# Patient Record
Sex: Male | Born: 2000 | Race: White | Hispanic: No | Marital: Single | Smoking: Never smoker
Health system: Southern US, Community
[De-identification: ages and names within clinical notes are randomized; demographics above are authoritative.]

## PROBLEM LIST (undated history)

## (undated) DIAGNOSIS — R011 Cardiac murmur, unspecified: Secondary | ICD-10-CM

## (undated) DIAGNOSIS — J45909 Unspecified asthma, uncomplicated: Secondary | ICD-10-CM

## (undated) HISTORY — PX: NO PAST SURGERIES: SHX2092

---
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
## 2018-06-26 ENCOUNTER — Encounter: Payer: Self-pay | Admitting: Emergency Medicine

## 2018-06-26 ENCOUNTER — Other Ambulatory Visit: Payer: Self-pay

## 2018-06-26 ENCOUNTER — Ambulatory Visit
Admission: EM | Admit: 2018-06-26 | Discharge: 2018-06-26 | Disposition: A | Discharge status: Home / Self Care | Payer: Medicaid Other | Attending: Family Medicine | Admitting: Family Medicine

## 2018-06-26 DIAGNOSIS — R062 Wheezing: Secondary | ICD-10-CM

## 2018-06-26 DIAGNOSIS — R05 Cough: Secondary | ICD-10-CM | POA: Diagnosis not present

## 2018-06-26 DIAGNOSIS — J209 Acute bronchitis, unspecified: Secondary | ICD-10-CM

## 2018-06-26 DIAGNOSIS — R0982 Postnasal drip: Secondary | ICD-10-CM

## 2018-06-26 DIAGNOSIS — R0981 Nasal congestion: Secondary | ICD-10-CM

## 2018-06-26 DIAGNOSIS — Z8709 Personal history of other diseases of the respiratory system: Secondary | ICD-10-CM

## 2018-06-26 MED ORDER — PREDNISONE 20 MG PO TABS: 40.0000 mg | ORAL_TABLET | Freq: Every day | ORAL | 0 refills | Status: DC

## 2018-06-26 MED ORDER — AZITHROMYCIN 250 MG PO TABS: ORAL_TABLET | ORAL | 0 refills | Status: DC

## 2018-06-26 NOTE — ED Provider Notes (Signed)
[facility] ____________________________________________  Time seen: Approximately 1:45 PM  I have reviewed the triage vital signs and the nursing notes.   HISTORY  Chief Complaint Cough   HPI Alejandro Pope is a 18 y.o. male past medical history of asthma presenting for evaluation of cough complaints present for the last 3 to 4 weeks.  States initially started off with a cold with runny nose, nasal congestion and cough.  States nasal congestion nasal drainage has near fully resolved but continues with the cough.  Has had intermittent wheezing.  States does feel sore in his chest from coughing.  Denies chest pain or shortness of breath.  No hemoptysis.  States sputum is greenish in color.  Denies sinus pain.  Denies known fevers.  Unresolved with over-the-counter cough and congestion medication.  Does still have albuterol inhaler at home and has occasionally used which helps.  Reports otherwise doing well.  Denies recent sickness.   Past Medical History:  Diagnosis Date  . Asthma   . Heart murmur     There are no active problems to display for this patient.   History reviewed. No pertinent surgical history.   No current facility-administered medications for this encounter.   Current Outpatient Medications:  .  albuterol (PROVENTIL HFA;VENTOLIN HFA) 108 (90 Base) MCG/ACT inhaler, Inhale 1-2 puffs into the lungs every 6 (six) hours as needed for wheezing or shortness of breath., Disp: , Rfl:  .  azithromycin (ZITHROMAX Z-PAK) 250 MG tablet, Take 2 tablets (500 mg) on  Day 1,  followed by 1 tablet (250 mg) once daily on Days 2 through 5., Disp: 6 each, Rfl: 0 .  hydrOXYzine (VISTARIL) 25 MG capsule, Take 1 capsule (25 mg total) by mouth every 8 (eight) hours as needed for anxiety., Disp: 30 capsule, Rfl: 0 .  predniSONE (DELTASONE) 20 MG tablet, Take 2 tablets (40 mg total) by mouth daily., Disp: 10 tablet, Rfl: 0  Allergies Patient has no known allergies.  History  reviewed. No pertinent family history.  Social History Social History   Tobacco Use  . Smoking status: Never Smoker  . Smokeless tobacco: Never Used  Substance Use Topics  . Alcohol use: No  . Drug use: Never    Review of Systems Constitutional: No fever ENT: No sore throat. As above.  Cardiovascular: Denies chest pain. Respiratory: Denies shortness of breath. Gastrointestinal: No abdominal pain.   Musculoskeletal: Negative for back pain. Skin: Negative for rash.   ____________________________________________   PHYSICAL EXAM:  VITAL SIGNS: ED Triage Vitals  Enc Vitals Group     BP 06/26/18 1208 113/73     Pulse Rate 06/26/18 1208 77     Resp 06/26/18 1208 16     Temp 06/26/18 1208 98.1 F (36.7 C)     Temp Source 06/26/18 1208 Oral     SpO2 06/26/18 1208 99 %     Weight 06/26/18 1206 150 lb (68 kg)     Height 06/26/18 1206 6' (1.829 m)     Head Circumference --      Peak Flow --      Pain Score 06/26/18 1206 9     Pain Loc --      Pain Edu? --      Excl. in GC? --     Constitutional: Alert and oriented. Well appearing and in no acute distress. Eyes: Conjunctivae are normal. Head: Atraumatic. No sinus tenderness to palpation. No swelling. No erythema.  Ears: no erythema, normal TMs bilaterally.  Nose:Mild nasal congestion  Mouth/Throat: Mucous membranes are moist. No pharyngeal erythema. No tonsillar swelling or exudate.  Neck: No stridor.  No cervical spine tenderness to palpation. Hematological/Lymphatic/Immunilogical: No cervical lymphadenopathy. Cardiovascular: Normal rate, regular rhythm. Grossly normal heart sounds.  Good peripheral circulation. Respiratory: Normal respiratory effort.  No retractions.  Mild scattered rhonchi.  No wheezing.  Good air movement.  Occasional dry cough noted with bronchospasm.  Speaks in complete sentences. Musculoskeletal: Ambulatory with steady gait. No cervical, thoracic or lumbar tenderness to palpation.  No lower  extremity edema noted bilaterally. Neurologic:  Normal speech and language. No gait instability. Skin:  Skin appears warm, dry and intact. No rash noted. Psychiatric: Mood and affect are normal. Speech and behavior are normal. ___________________________________________   LABS (all labs ordered are listed, but only abnormal results are displayed)  Labs Reviewed - No data to display ____________________________________________   PROCEDURES Procedures    INITIAL IMPRESSION / ASSESSMENT AND PLAN / ED COURSE  Pertinent labs & imaging results that were available during my care of the patient were reviewed by me and considered in my medical decision making (see chart for details).  Well-appearing patient.  No acute distress.  Suspect recent viral upper respiratory infection.  Suspect bronchitis.  As symptoms have continued for 3 to 4 weeks, will empirically treat with oral azithromycin, prednisone and continue home albuterol inhaler as needed.  Continue over-the-counter cough congestion medication.  Work note given for today.Discussed indication, risks and benefits of medications with patient.  Discussed follow up with Primary care physician this week. Discussed follow up and return parameters including no resolution or any worsening concerns. Patient verbalized understanding and agreed to plan.   ____________________________________________   FINAL CLINICAL IMPRESSION(S) / ED DIAGNOSES  Final diagnoses:  Acute bronchitis, unspecified organism     ED Discharge Orders         Ordered    predniSONE (DELTASONE) 20 MG tablet  Daily     06/26/18 1250    azithromycin (ZITHROMAX Z-PAK) 250 MG tablet     06/26/18 1250           Note: This dictation was prepared with Dragon dictation along with smaller phrase technology. Any transcriptional errors that result from this process are unintentional.         Renford Dills, NP 06/26/18 1348

## 2018-06-26 NOTE — ED Triage Notes (Signed)
Patient c/o cough and chest congestion for a month.  Patient reports fever.

## 2018-06-26 NOTE — Discharge Instructions (Addendum)
Take medication as prescribed. Rest. Drink plenty of fluids. Continue inhaler.   Follow up with your primary care physician this week as needed. Return to Urgent care for new or worsening concerns.

## 2018-09-09 ENCOUNTER — Other Ambulatory Visit: Payer: Self-pay

## 2018-09-09 ENCOUNTER — Ambulatory Visit
Admission: EM | Admit: 2018-09-09 | Discharge: 2018-09-09 | Disposition: A | Discharge status: Home / Self Care | Payer: Medicaid Other | Attending: Family Medicine | Admitting: Family Medicine

## 2018-09-09 DIAGNOSIS — M79672 Pain in left foot: Secondary | ICD-10-CM | POA: Diagnosis not present

## 2018-09-09 DIAGNOSIS — M79671 Pain in right foot: Secondary | ICD-10-CM

## 2018-09-09 MED ORDER — MELOXICAM 15 MG PO TABS: 15.0000 mg | ORAL_TABLET | Freq: Every day | ORAL | 0 refills | Status: DC | PRN

## 2018-09-09 NOTE — Discharge Instructions (Signed)
Rest.  Ice.  Try heel cups in your shoes.  Medication as prescribed.  Take care  Dr. Adriana Simas
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
## 2018-11-03 ENCOUNTER — Emergency Department: Payer: Medicaid Other

## 2018-11-03 ENCOUNTER — Other Ambulatory Visit: Payer: Self-pay

## 2018-11-03 ENCOUNTER — Encounter: Payer: Self-pay | Admitting: Emergency Medicine

## 2018-11-03 ENCOUNTER — Emergency Department
Admission: EM | Admit: 2018-11-03 | Discharge: 2018-11-03 | Disposition: A | Discharge status: Home / Self Care | Payer: Medicaid Other | Attending: Student in an Organized Health Care Education/Training Program | Admitting: Student in an Organized Health Care Education/Training Program

## 2018-11-03 DIAGNOSIS — J45909 Unspecified asthma, uncomplicated: Secondary | ICD-10-CM | POA: Insufficient documentation

## 2018-11-03 DIAGNOSIS — S161XXA Strain of muscle, fascia and tendon at neck level, initial encounter: Secondary | ICD-10-CM | POA: Diagnosis not present

## 2018-11-03 DIAGNOSIS — M25551 Pain in right hip: Secondary | ICD-10-CM | POA: Insufficient documentation

## 2018-11-03 DIAGNOSIS — Y9389 Activity, other specified: Secondary | ICD-10-CM | POA: Insufficient documentation

## 2018-11-03 DIAGNOSIS — Y999 Unspecified external cause status: Secondary | ICD-10-CM | POA: Insufficient documentation

## 2018-11-03 DIAGNOSIS — M25552 Pain in left hip: Secondary | ICD-10-CM | POA: Insufficient documentation

## 2018-11-03 DIAGNOSIS — M7918 Myalgia, other site: Secondary | ICD-10-CM

## 2018-11-03 DIAGNOSIS — Y92411 Interstate highway as the place of occurrence of the external cause: Secondary | ICD-10-CM | POA: Insufficient documentation

## 2018-11-03 DIAGNOSIS — S199XXA Unspecified injury of neck, initial encounter: Secondary | ICD-10-CM | POA: Diagnosis present

## 2018-11-03 DIAGNOSIS — R51 Headache: Secondary | ICD-10-CM | POA: Insufficient documentation

## 2018-11-03 MED ORDER — IBUPROFEN 600 MG PO TABS
600.0000 mg | ORAL_TABLET | Freq: Once | ORAL | Status: AC
  Administered 2018-11-03: 600 mg via ORAL
  Filled 2018-11-03: qty 1

## 2018-11-03 MED ORDER — TRAMADOL HCL 50 MG PO TABS: 50.0000 mg | ORAL_TABLET | Freq: Four times a day (QID) | ORAL | 0 refills | Status: AC | PRN

## 2018-11-03 MED ORDER — IBUPROFEN 600 MG PO TABS: 600.0000 mg | ORAL_TABLET | Freq: Three times a day (TID) | ORAL | 0 refills | Status: DC | PRN

## 2018-11-03 MED ORDER — CYCLOBENZAPRINE HCL 10 MG PO TABS
10.0000 mg | ORAL_TABLET | Freq: Once | ORAL | Status: AC
  Administered 2018-11-03: 10 mg via ORAL
  Filled 2018-11-03: qty 1

## 2018-11-03 MED ORDER — CYCLOBENZAPRINE HCL 10 MG PO TABS: 10.0000 mg | ORAL_TABLET | Freq: Three times a day (TID) | ORAL | 0 refills | Status: DC | PRN

## 2018-11-03 MED ORDER — TRAMADOL HCL 50 MG PO TABS
50.0000 mg | ORAL_TABLET | Freq: Once | ORAL | Status: AC
  Administered 2018-11-03: 50 mg via ORAL
  Filled 2018-11-03: qty 1
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
## 2018-12-01 ENCOUNTER — Telehealth: Payer: Medicaid Other | Admitting: Family

## 2018-12-01 DIAGNOSIS — Z20822 Contact with and (suspected) exposure to covid-19: Secondary | ICD-10-CM

## 2018-12-01 NOTE — Progress Notes (Signed)
E-Visit for Corona Virus Screening   Your current symptoms could be consistent with the coronavirus.  Many health care providers can now test patients at their office but not all are.  [facility] has multiple testing sites. For information on our COVID testing locations and hours go to [email or web address]  Please quarantine yourself while awaiting your test results.  We are enrolling you in our MyChart [facility] for COVID19 . Daily you will receive a questionnaire within the MyChart website. Our COVID 19 response team willl be monitoriing your responses daily.    COVID-19 is a respiratory illness with symptoms that are similar to the flu. Symptoms are typically mild to moderate, but there have been cases of severe illness and death due to the virus. The following symptoms may appear 2-14 days after exposure: . Fever . Cough . Shortness of breath or difficulty breathing . Chills . Repeated shaking with chills . Muscle pain . Headache . Sore throat . New loss of taste or smell . Fatigue . Congestion or runny nose . Nausea or vomiting . Diarrhea  It is vitally important that if you feel that you have an infection such as this virus or any other virus that you stay home and away from places where you may spread it to others.  You should self-quarantine for 14 days if you have symptoms that could potentially be coronavirus or have been in close contact a with a person diagnosed with COVID-19 within the last 2 weeks. You should avoid contact with people age 65 and older.   You should wear a mask or cloth face covering over your nose and mouth if you must be around other people or animals, including pets (even at home). Try to stay at least 6 feet away from other people. This will protect the people around you.  You may also take acetaminophen (Tylenol) as needed for fever.   Reduce your risk of any infection by using the same precautions used for avoiding the  common cold or flu:  . Wash your hands often with soap and warm water for at least 20 seconds.  If soap and water are not readily available, use an alcohol-based hand sanitizer with at least 60% alcohol.  . If coughing or sneezing, cover your mouth and nose by coughing or sneezing into the elbow areas of your shirt or coat, into a tissue or into your sleeve (not your hands). . Avoid shaking hands with others and consider head nods or verbal greetings only. . Avoid touching your eyes, nose, or mouth with unwashed hands.  . Avoid close contact with people who are sick. . Avoid places or events with large numbers of people in one location, like concerts or sporting events. . Carefully consider travel plans you have or are making. . If you are planning any travel outside or inside the [place], visit the CDC's Travelers' Health webpage for the latest health notices. . If you have some symptoms but not all symptoms, continue to monitor at home and seek medical attention if your symptoms worsen. . If you are having a medical emergency, call 911.  HOME CARE . Only take medications as instructed by your medical team. . Drink plenty of fluids and get plenty of rest. . A steam or ultrasonic humidifier can help if you have congestion.   GET HELP RIGHT AWAY IF YOU HAVE EMERGENCY WARNING SIGNS** FOR COVID-19. If you or someone is showing any of these signs seek emergency medical care immediately. Call   911 or proceed to your closest emergency facility if: . You develop worsening high fever. . Trouble breathing . Bluish lips or face . Persistent pain or pressure in the chest . New confusion . Inability to wake or stay awake . You cough up blood. . Your symptoms become more severe  **This list is not all possible symptoms. Contact your medical provider for any symptoms that are sever or concerning to you.   MAKE SURE YOU   Understand these instructions.  Will watch your condition.  Will get help right  away if you are not doing well or get worse.  Your e-visit answers were reviewed by a board certified advanced clinical practitioner to complete your personal care plan.  Depending on the condition, your plan could have included both over the counter or prescription medications.  If there is a problem please reply once you have received a response from your provider.  Your safety is important to us.  If you have drug allergies check your prescription carefully.    You can use MyChart to ask questions about today's visit, request a non-urgent call back, or ask for a work or school excuse for 24 hours related to this e-Visit. If it has been greater than 24 hours you will need to follow up with your provider, or enter a new e-Visit to address those concerns. You will get an e-mail in the next two days asking about your experience.  I hope that your e-visit has been valuable and will speed your recovery. Thank you for using e-visits.   Greater than 5 minutes, yet less than 10 minutes of time have been spent researching, coordinating, and implementing care for this patient today.  Thank you for the details you included in the comment boxes. Those details are very helpful in determining the best course of treatment for you and help us to provide the best care.  
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
## 2020-03-28 ENCOUNTER — Ambulatory Visit: Payer: Medicaid Other | Admitting: Family Medicine

## 2020-03-28 NOTE — Progress Notes (Deleted)
New patient visit   Patient: Alejandro Pope   DOB: July 23, 2000   19 y.o. Male  MRN: [id number] Visit Date: 03/28/2020  Today's healthcare provider: Dortha Kern, PA   No chief complaint on file.  Subjective    Alejandro Pope is a 19 y.o. male who presents today as a new patient to establish care.  HPI  ***  Past Medical History:  Diagnosis Date  . Asthma   . Heart murmur    Past Surgical History:  Procedure Laterality Date  . NO PAST SURGERIES     Family Status  Relation Name Status  . Mother  Deceased  . Father unknown Other   No family history on file. Social History   Socioeconomic History  . Marital status: Single    Spouse name: Not on file  . Number of children: Not on file  . Years of education: Not on file  . Highest education level: Not on file  Occupational History  . Not on file  Tobacco Use  . Smoking status: Never Smoker  . Smokeless tobacco: Never Used  Vaping Use  . Vaping Use: Former  Substance and Sexual Activity  . Alcohol use: No  . Drug use: Yes    Types: Marijuana  . Sexual activity: Not on file  Other Topics Concern  . Not on file  Social History Narrative  . Not on file   Social Determinants of Health   Financial Resource Strain:   . Difficulty of Paying Living Expenses: Not on file  Food Insecurity:   . Worried About Programme researcher, broadcasting/film/video in the Last Year: Not on file  . Ran Out of Food in the Last Year: Not on file  Transportation Needs:   . Lack of Transportation (Medical): Not on file  . Lack of Transportation (Non-Medical): Not on file  Physical Activity:   . Days of Exercise per Week: Not on file  . Minutes of Exercise per Session: Not on file  Stress:   . Feeling of Stress : Not on file  Social Connections:   . Frequency of Communication with Friends and Family: Not on file  . Frequency of Social Gatherings with Friends and Family: Not on file  . Attends Religious Services: Not on file  . Active Member of  Clubs or Organizations: Not on file  . Attends Banker Meetings: Not on file  . Marital Status: Not on file   Outpatient Medications Prior to Visit  Medication Sig  . cyclobenzaprine (FLEXERIL) 10 MG tablet Take 1 tablet (10 mg total) by mouth 3 (three) times daily as needed.  Marland Kitchen ibuprofen (ADVIL) 600 MG tablet Take 1 tablet (600 mg total) by mouth every 8 (eight) hours as needed.  . meloxicam (MOBIC) 15 MG tablet Take 1 tablet (15 mg total) by mouth daily as needed for pain.   No facility-administered medications prior to visit.   No Known Allergies   There is no immunization history on file for this patient.  Health Maintenance  Topic Date Due  . Hepatitis C Screening  Never done  . COVID-19 Vaccine (1) Never done  . HIV Screening  Never done  . TETANUS/TDAP  Never done  . INFLUENZA VACCINE  Never done    Patient Care Team: Herb Grays, MD as PCP - General (Pediatrics)  Review of Systems  Constitutional: Negative.   HENT: Negative.   Eyes: Negative.   Respiratory: Negative.   Cardiovascular: Negative.   Gastrointestinal:  Negative.   Endocrine: Negative.   Genitourinary: Negative.   Musculoskeletal: Negative.   Skin: Negative.   Allergic/Immunologic: Negative.   Neurological: Negative.   Hematological: Negative.   Psychiatric/Behavioral: Negative.     {Heme  Chem  Endocrine  Serology  Results Review (optional):[id number]::" "}  Objective    There were no vitals taken for this visit. Physical Exam Constitutional:      Appearance: Normal appearance. He is normal weight.  HENT:     Head: Normocephalic and atraumatic.     Right Ear: Tympanic membrane, ear canal and external ear normal.     Left Ear: Tympanic membrane, ear canal and external ear normal.     Nose: Nose normal.     Mouth/Throat:     Mouth: Mucous membranes are moist.     Pharynx: Oropharynx is clear.  Eyes:     Extraocular Movements: Extraocular movements intact.      Conjunctiva/sclera: Conjunctivae normal.     Pupils: Pupils are equal, round, and reactive to light.  Cardiovascular:     Rate and Rhythm: Normal rate and regular rhythm.     Pulses: Normal pulses.     Heart sounds: Normal heart sounds.  Pulmonary:     Effort: Pulmonary effort is normal.     Breath sounds: Normal breath sounds.  Abdominal:     General: Abdomen is flat. Bowel sounds are normal.     Palpations: Abdomen is soft.  Musculoskeletal:        General: Normal range of motion.     Cervical back: Normal range of motion and neck supple.  Skin:    General: Skin is warm and dry.  Neurological:     General: No focal deficit present.     Mental Status: He is alert and oriented to person, place, and time. Mental status is at baseline.  Psychiatric:        Mood and Affect: Mood normal.        Behavior: Behavior normal.        Thought Content: Thought content normal.        Judgment: Judgment normal.    ***  Depression Screen No flowsheet data found. No results found for any visits on 03/28/20.  Assessment & Plan     ***  No follow-ups on file.     {provider attestation***:1}   Dortha Kern, PA  [facility] [phone] (phone) [phone] (fax)  [facility]

## 2022-01-29 ENCOUNTER — Encounter: Payer: Self-pay | Admitting: Emergency Medicine

## 2022-01-29 ENCOUNTER — Emergency Department
Admission: EM | Admit: 2022-01-29 | Discharge: 2022-01-29 | Disposition: A | Discharge status: Home / Self Care | Payer: 59 | Attending: Emergency Medicine | Admitting: Emergency Medicine

## 2022-01-29 ENCOUNTER — Other Ambulatory Visit: Payer: Self-pay

## 2022-01-29 DIAGNOSIS — J029 Acute pharyngitis, unspecified: Secondary | ICD-10-CM | POA: Diagnosis not present

## 2022-01-29 DIAGNOSIS — B2790 Infectious mononucleosis, unspecified without complication: Secondary | ICD-10-CM | POA: Diagnosis not present

## 2022-01-29 MED ORDER — CEPACOL REGULAR STRENGTH 3 MG MT LOZG: 1.0000 | LOZENGE | OROMUCOSAL | 12 refills | Status: DC | PRN

## 2022-01-29 MED ORDER — DEXAMETHASONE SODIUM PHOSPHATE 10 MG/ML IJ SOLN
10.0000 mg | Freq: Once | INTRAMUSCULAR | Status: AC
  Administered 2022-01-29: 10 mg via INTRAMUSCULAR
  Filled 2022-01-29: qty 1

## 2022-01-29 MED ORDER — MELOXICAM 15 MG PO TABS: 15.0000 mg | ORAL_TABLET | Freq: Every day | ORAL | 0 refills | Status: AC

## 2022-01-29 NOTE — ED Provider Notes (Signed)
[facility] Provider Note    Event Date/Time   First MD Initiated Contact with Patient 01/29/22 1348     (approximate)   History   Chief Complaint Sore Throat   HPI Alejandro Pope is a 21 y.o. male, no significant medical history, presents emergency department for evaluation of sore throat.  He states that he was seen at urgent care yesterday and diagnosed with mononucleosis.  His symptoms started approximately 4 days ago, which include sore throat and body aches.  The urgent care reportedly did not provide any medications to manage symptoms.  He states that his throat hurt so bad that it is difficult to eat or drink anything.  He is currently only taking Tylenol and doing salt water rinses.  Denies chest pain, shortness of breath, abdominal pain, flank pain, nausea/vomiting, diarrhea, dysuria, vision change, hearing changes, rash/lesions, numb/tingling upper or lower extremities, or dizziness/lightheadedness.  History Limitations: No limitations.        Physical Exam  Triage Vital Signs: ED Triage Vitals [01/29/22 1323]  Enc Vitals Group     BP 126/85     Pulse Rate 78     Resp 16     Temp 98.4 F (36.9 C)     Temp Source Oral     SpO2 100 %     Weight 145 lb (65.8 kg)     Height 5\' 10"  (1.778 m)     Head Circumference      Peak Flow      Pain Score 10     Pain Loc      Pain Edu?      Excl. in [place]?     Most recent vital signs: Vitals:   01/29/22 1323  BP: 126/85  Pulse: 78  Resp: 16  Temp: 98.4 F (36.9 C)  SpO2: 100%    General: Awake, NAD.  Skin: Warm, dry. No rashes or lesions.  Eyes: PERRL. Conjunctivae normal.  CV: Good peripheral perfusion.  Resp: Normal effort.  Abd: Soft, non-tender. No distention.  Neuro: At baseline. No gross neurological deficits.  Musculoskeletal: Normal ROM of all extremities.  Focused Exam: Throat is exquisitely erythematous.  No tonsillar swelling or exudates.  No trismus or drooling.  No  oropharyngeal edema.  Physical Exam    ED Results / Procedures / Treatments  Labs (all labs ordered are listed, but only abnormal results are displayed) Labs Reviewed - No data to display   EKG N/A.    RADIOLOGY  ED Provider Interpretation: N/A.  No results found.  PROCEDURES:  Critical Care performed: N/A.  Procedures    MEDICATIONS ORDERED IN ED: Medications  dexamethasone (DECADRON) injection 10 mg (10 mg Intramuscular Given 01/29/22 1456)     IMPRESSION / MDM / ASSESSMENT AND PLAN / ED COURSE  I reviewed the triage vital signs and the nursing notes.                              Differential diagnosis includes, but is not limited to, viral URI, infectious mononucleosis, COVID-19, influenza,  Assessment/Plan Presentation consistent with infectious mononucleosis.  He is currently on day 4 of his symptoms and is seeking supportive treatment.  Physical exam does show a notably erythematous throat, consistent with the patient's chief complaint.  Endorses some feeling of swelling in the throat as well making eating/drinking difficult.  We will provide with a single dose of dexamethasone here today.  Additionally provide him with prescriptions for meloxicam and cepacol throat lozenges to help manage his symptoms.  Recommend that he follow-up with his primary care provider within the next week for reevaluation.  He was amenable to this plan.  Will discharge.  Provided the patient with anticipatory guidance, return precautions, and educational material. Encouraged the patient to return to the emergency department at any time if they begin to experience any new or worsening symptoms. Patient expressed understanding and agreed with the plan.   Patient's presentation is most consistent with acute, uncomplicated illness.       FINAL CLINICAL IMPRESSION(S) / ED DIAGNOSES   Final diagnoses:  Infectious mononucleosis without complication, infectious mononucleosis due to  unspecified organism  Sore throat     Rx / DC Orders   ED Discharge Orders          Ordered    meloxicam (MOBIC) 15 MG tablet  Daily        01/29/22 1443    menthol-cetylpyridinium (CEPACOL REGULAR STRENGTH) 3 MG lozenge  As needed        01/29/22 1443             Note:  This document was prepared using Dragon voice recognition software and may include unintentional dictation errors.   Teodoro Spray, [place] 01/29/22 1515    Harvest Dark, MD 01/30/22 1530

## 2022-01-29 NOTE — ED Notes (Signed)
See triage note Presents with sore throat  States he was seen and dx'd with Mono yesterday  Afebrile at present  Did have negative COVID and strep test yesterday

## 2022-01-29 NOTE — Discharge Instructions (Addendum)
-  You may take the meloxicam as need for the body aches.  You may additionally take acetaminophen.  -You may utilize the cepacol lozenges as needed for sore throat.  -Follow-up with your primary care provider within the next week for reevaluation.  -Return to the emergency department anytime if you begin to experience any new or worsening symptoms.

## 2022-01-29 NOTE — ED Provider Notes (Incomplete)
   [facility] Provider Note    Event Date/Time   First MD Initiated Contact with Patient 01/29/22 1348     (approximate)   History   Chief Complaint Sore Throat   HPI Alejandro Pope is a 21 y.o. male, history of asthma, presents to the emergency department for evaluation of sore throat.  *** Denies fever/chills, chest pain, shortness of breath, abdominal pain, flank pain, nausea/vomiting, diarrhea, urinary symptoms, headache, vision changes, hearing changes, rashes/lesions, numbness/tingling in upper or lower extremities, or dizziness/lightheadedness.   Per records review, ***  History Limitations: ***        Physical Exam  Triage Vital Signs: ED Triage Vitals [01/29/22 1323]  Enc Vitals Group     BP 126/85     Pulse Rate 78     Resp 16     Temp 98.4 F (36.9 C)     Temp Source Oral     SpO2 100 %     Weight 145 lb (65.8 kg)     Height 5\' 10"  (1.778 m)     Head Circumference      Peak Flow      Pain Score 10     Pain Loc      Pain Edu?      Excl. in [place]?     Most recent vital signs: Vitals:   01/29/22 1323  BP: 126/85  Pulse: 78  Resp: 16  Temp: 98.4 F (36.9 C)  SpO2: 100%    General: Awake, NAD.  Skin: Warm, dry. No rashes or lesions.  Eyes: PERRL. Conjunctivae normal.  CV: Good peripheral perfusion.  Resp: Normal effort.  Abd: Soft, non-tender. No distention.  Neuro: At baseline. No gross neurological deficits.  Musculoskeletal: Normal ROM of all extremities.  Focused Exam: ***  Physical Exam    ED Results / Procedures / Treatments  Labs (all labs ordered are listed, but only abnormal results are displayed) Labs Reviewed - No data to display   EKG ***    RADIOLOGY  ED Provider Interpretation: ***  No results found.  PROCEDURES:  Critical Care performed: ***  Procedures    MEDICATIONS ORDERED IN ED: Medications - No data to display   IMPRESSION / MDM / [place] / ED COURSE  I  reviewed the triage vital signs and the nursing notes.                              Differential diagnosis includes, but is not limited to, ***  ED Course ***  Assessment/Plan ***  Considered admission for this patient, but ***  ***Provided the patient with anticipatory guidance, return precautions, and educational material. Encouraged the patient to return to the emergency department at any time if they begin to experience any new or worsening symptoms. Patient expressed understanding and agreed with the plan.   Patient's presentation is most consistent with {EM COPA:27473}       FINAL CLINICAL IMPRESSION(S) / ED DIAGNOSES   Final diagnoses:  None     Rx / DC Orders   ED Discharge Orders     None        Note:  This document was prepared using Dragon voice recognition software and may include unintentional dictation errors.

## 2022-01-29 NOTE — ED Triage Notes (Signed)
Pt states was seen at urgent care yesterday and diagnosed with mono. Pt states strep and covid tests were negative. Pt states he continues to have a sore throat. Speech clear, no drooling or distress noted.

## 2022-02-01 ENCOUNTER — Encounter: Payer: Self-pay | Admitting: Emergency Medicine

## 2022-02-01 ENCOUNTER — Other Ambulatory Visit: Payer: Self-pay

## 2022-02-01 ENCOUNTER — Emergency Department
Admission: EM | Admit: 2022-02-01 | Discharge: 2022-02-01 | Disposition: A | Discharge status: Home / Self Care | Payer: 59 | Attending: Emergency Medicine | Admitting: Emergency Medicine

## 2022-02-01 DIAGNOSIS — K209 Esophagitis, unspecified without bleeding: Secondary | ICD-10-CM | POA: Diagnosis not present

## 2022-02-01 DIAGNOSIS — B2799 Infectious mononucleosis, unspecified with other complication: Secondary | ICD-10-CM | POA: Diagnosis not present

## 2022-02-01 DIAGNOSIS — J029 Acute pharyngitis, unspecified: Secondary | ICD-10-CM | POA: Diagnosis present

## 2022-02-01 MED ORDER — LIDOCAINE VISCOUS HCL 2 % MT SOLN
15.0000 mL | Freq: Once | OROMUCOSAL | Status: AC
  Administered 2022-02-01: 15 mL via OROMUCOSAL
  Filled 2022-02-01: qty 15

## 2022-02-01 MED ORDER — LACTATED RINGERS IV BOLUS
1000.0000 mL | Freq: Once | INTRAVENOUS | Status: AC
  Administered 2022-02-01: 1000 mL via INTRAVENOUS

## 2022-02-01 MED ORDER — SUCRALFATE 1 GM/10ML PO SUSP: 1.0000 g | Freq: Three times a day (TID) | ORAL | 0 refills | Status: AC

## 2022-02-01 MED ORDER — DEXAMETHASONE SODIUM PHOSPHATE 10 MG/ML IJ SOLN
10.0000 mg | Freq: Once | INTRAMUSCULAR | Status: AC
  Administered 2022-02-01: 10 mg via INTRAVENOUS
  Filled 2022-02-01: qty 1

## 2022-02-01 MED ORDER — LIDOCAINE VISCOUS HCL 2 % MT SOLN: 15.0000 mL | OROMUCOSAL | 0 refills | Status: DC | PRN

## 2022-02-01 MED ORDER — DICYCLOMINE HCL 10 MG/5ML PO SOLN
10.0000 mg | Freq: Once | ORAL | Status: DC
  Filled 2022-02-01 (×2): qty 5

## 2022-02-01 NOTE — ED Provider Notes (Signed)
[facility] Provider Note    Event Date/Time   First MD Initiated Contact with Patient 02/01/22 1431     (approximate)   History   Sore Throat and Gastroesophageal Reflux (Mono- burning when swallowing)   HPI  Alejandro Pope is a 21 y.o. male  presents to the emergency department for evaluation of esophageal burning with eating or drinking. He does have mono and is now taking zofran and meloxicam. No improvement of burning with throat spray.       Physical Exam   Triage Vital Signs: ED Triage Vitals  Enc Vitals Group     BP 02/01/22 1257 117/85     Pulse Rate 02/01/22 1257 72     Resp 02/01/22 1257 16     Temp 02/01/22 1257 98.3 F (36.8 C)     Temp src --      SpO2 02/01/22 1257 99 %     Weight 02/01/22 1416 144 lb 13.5 oz (65.7 kg)     Height 02/01/22 1416 5\' 10"  (1.778 m)     Head Circumference --      Peak Flow --      Pain Score 02/01/22 1257 0     Pain Loc --      Pain Edu? --      Excl. in [place]? --     Most recent vital signs: Vitals:   02/01/22 1257  BP: 117/85  Pulse: 72  Resp: 16  Temp: 98.3 F (36.8 C)  SpO2: 99%     General: Awake, no distress.  CV:  Good peripheral perfusion.  Resp:  Normal effort.  Abd:  No distention.  Other:  Throat is erythematous, tonsils 1+, uvula is midline. No palpable anterior cervical adenopathy.    ED Results / Procedures / Treatments   Labs (all labs ordered are listed, but only abnormal results are displayed) Labs Reviewed - No data to display   EKG  Not indicated.   RADIOLOGY Not indicated.   PROCEDURES:  Critical Care performed: No  Procedures   MEDICATIONS ORDERED IN ED: Medications  dicyclomine (BENTYL) 10 MG/5ML solution 10 mg (has no administration in time range)  lactated ringers bolus 1,000 mL (1,000 mLs Intravenous New Bag/Given 02/01/22 1512)  lidocaine (XYLOCAINE) 2 % viscous mouth solution 15 mL (15 mLs Mouth/Throat Given 02/01/22 1512)  dexamethasone  (DECADRON) injection 10 mg (10 mg Intravenous Given 02/01/22 1512)     IMPRESSION / MDM / ASSESSMENT AND PLAN / ED COURSE  I reviewed the triage vital signs and the nursing notes.                              Differential diagnosis includes, but is not limited to, mono, tonsillitis, peritonsillar abscess, esophagitis, GERD  Patient's presentation is most consistent with acute illness / injury with system symptoms.  21 year old male presenting to the emergency department for treatment and evaluation of esophageal burning with attempt to eat or drink.  He has had similar symptoms for the past week.  He was diagnosed with mono.  Vital signs are all within normal limits.  Patient states that he had a fever yesterday of 100.8 but has not noted a fever today. Plan will be to give him some IV fluids and a GI cocktail and reassess.  Clinical Course as of 02/01/22 1543  Fri Feb 01, 2022  1537 Patient feeling much better. IV fluids almost finished. Plan  will be to send him home with rx for viscous lidocaine and carafate. [CT]    Clinical Course User Index [CT] Kailani Brass B, FNP     FINAL CLINICAL IMPRESSION(S) / ED DIAGNOSES   Final diagnoses:  Infectious mononucleosis, with other complication, infectious mononucleosis due to unspecified organism  Esophagitis, acute     Rx / DC Orders   ED Discharge Orders          Ordered    lidocaine (XYLOCAINE) 2 % solution  Every 4 hours PRN        02/01/22 1540    sucralfate (CARAFATE) 1 GM/10ML suspension  3 times daily with meals & bedtime        02/01/22 1540             Note:  This document was prepared using Dragon voice recognition software and may include unintentional dictation errors.   Chinita Pester, FNP 02/01/22 1546    Concha Se, MD 02/02/22 [phone]

## 2022-02-01 NOTE — ED Triage Notes (Signed)
Pt in ED from home---complaining of esophageal burning when he eats or drinks that is inhibiting intake. Pt is positive for mono and reports treating with lidocaine spray and gargling salt water with no relief. Pt went to [facility] ED yesterday for abdominal pain and was in [facility] ED last week for similar symptoms.

## 2022-02-01 NOTE — ED Notes (Signed)
Pt gave verbal understanding of MSE waiver. Keyboard broken.

## 2023-09-05 DIAGNOSIS — Z203 Contact with and (suspected) exposure to rabies: Secondary | ICD-10-CM | POA: Insufficient documentation

## 2023-11-05 ENCOUNTER — Ambulatory Visit: Payer: Self-pay | Admitting: Family Medicine

## 2023-11-05 ENCOUNTER — Ambulatory Visit
Admission: EM | Admit: 2023-11-05 | Discharge: 2023-11-05 | Disposition: A | Discharge status: Home / Self Care | Payer: Self-pay | Attending: Emergency Medicine | Admitting: Emergency Medicine

## 2023-11-05 DIAGNOSIS — R059 Cough, unspecified: Secondary | ICD-10-CM

## 2023-11-05 DIAGNOSIS — Z720 Tobacco use: Secondary | ICD-10-CM

## 2023-11-05 DIAGNOSIS — R051 Acute cough: Secondary | ICD-10-CM

## 2023-11-05 DIAGNOSIS — Z113 Encounter for screening for infections with a predominantly sexual mode of transmission: Secondary | ICD-10-CM

## 2023-11-05 DIAGNOSIS — H1032 Unspecified acute conjunctivitis, left eye: Secondary | ICD-10-CM

## 2023-11-05 DIAGNOSIS — J22 Unspecified acute lower respiratory infection: Secondary | ICD-10-CM

## 2023-11-05 MED ORDER — ERYTHROMYCIN 5 MG/GM OP OINT: TOPICAL_OINTMENT | OPHTHALMIC | 0 refills | Status: DC

## 2023-11-05 MED ORDER — DOXYCYCLINE HYCLATE 100 MG PO CAPS: 100.0000 mg | ORAL_CAPSULE | Freq: Two times a day (BID) | ORAL | 0 refills | Status: AC
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
## 2023-11-05 NOTE — ED Provider Notes (Signed)
 [facility]    CSN: [id number] Arrival date & time: 11/05/23  1106      History   Chief Complaint Chief Complaint  Patient presents with   Conjunctivitis   Cough    HPI Alejandro Pope is a 23 y.o. male.   23 year old male pt, Alejandro Pope, presents to urgent care for evaluation of cough x 2 weeks, eye discharge and swelling that started yesterday.  Patient denies any trauma or injury to his eye, patient use warm washcloth for comfort measures without relief  PMH: asthma,heart murmur  The history is provided by the patient. No language interpreter was used.    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 Authorizing Provider  doxycycline  (VIBRAMYCIN ) 100 MG capsule Take 1 capsule (100 mg total) by mouth 2 (two) times daily for 5 days. 11/05/23 11/10/23 Yes [facility], Rilla, NP  erythromycin  ophthalmic ointment Place a 1/2 inch ribbon of ointment into the left lower eyelid every 6 hours x 5 days 11/05/23  Yes Adeleigh Barletta, Rilla, NP  lidocaine  (XYLOCAINE ) 2 % solution Use as directed 15 mLs in the mouth or throat every 4 (four) hours as needed for mouth pain. 02/01/22   Triplett, Cari B, FNP  sucralfate  (CARAFATE ) 1 GM/10ML suspension Take 10 mLs (1 g total) by mouth 4 (four) times daily -  with meals and at bedtime. 02/01/22   Herlinda Kirk NOVAK, FNP    Family History History reviewed. No pertinent family history.  Social History Social History   Tobacco Use   Smoking status: Never   Smokeless tobacco: Never  Vaping Use   Vaping  status: Former  Substance Use Topics   Alcohol use: No   Drug use: Yes    Types: Marijuana     Allergies   Patient has no known allergies.   Review of Systems Review of Systems  Constitutional:  Negative for fever.  Eyes:  Positive for discharge and redness. Negative for visual disturbance.  Respiratory:  Positive for cough. Negative for shortness of breath, wheezing and stridor.   Cardiovascular:  Negative for chest pain and palpitations.  All other systems reviewed and are negative.    Physical Exam Triage Vital Signs ED Triage Vitals  Encounter Vitals Group     BP 11/05/23 1211 111/71     Girls Systolic BP Percentile --      Girls Diastolic BP Percentile --      Boys Systolic BP Percentile --      Boys Diastolic BP Percentile --      Pulse Rate 11/05/23 1211 65     Resp 11/05/23 1211 18     Temp 11/05/23 1211 98.5 F (36.9 C)     Temp Source 11/05/23 1211 Oral     SpO2 11/05/23 1211 97 %     Weight --      Height --      Head Circumference --      Peak Flow --  Pain Score 11/05/23 1210 0     Pain Loc --      Pain Education --      Exclude from Growth Chart --    No data found.  Updated Vital Signs BP 111/71 (BP Location: Right Arm)   Pulse 65   Temp 98.5 F (36.9 C) (Oral)   Resp 18   SpO2 97%   Visual Acuity Right Eye Distance:   Left Eye Distance:   Bilateral Distance:    Right Eye Near: R Near: 20/20 Left Eye Near:  L Near: 20/20 Bilateral Near:  20/20  Physical Exam Vitals and nursing note reviewed.  Constitutional:      General: He is not in acute distress.    Appearance: He is well-developed and well-groomed.  HENT:     Head: Normocephalic and atraumatic.  Eyes:     General: Vision grossly intact.     Extraocular Movements: Extraocular movements intact.     Conjunctiva/sclera:     Left eye: Left conjunctiva is injected. Exudate present.     Pupils: Pupils are equal, round, and reactive to light.  Cardiovascular:     Rate and  Rhythm: Normal rate and regular rhythm.     Heart sounds: Normal heart sounds. No murmur heard. Pulmonary:     Effort: Pulmonary effort is normal. No respiratory distress.     Breath sounds: Normal breath sounds and air entry.  Abdominal:     Palpations: Abdomen is soft.     Tenderness: There is no abdominal tenderness.  Musculoskeletal:        General: No swelling.     Cervical back: Neck supple.  Skin:    General: Skin is warm and dry.     Capillary Refill: Capillary refill takes less than 2 seconds.  Neurological:     General: No focal deficit present.     Mental Status: He is alert and oriented to person, place, and time.     GCS: GCS eye subscore is 4. GCS verbal subscore is 5. GCS motor subscore is 6.  Psychiatric:        Attention and Perception: Attention normal.        Mood and Affect: Mood normal.        Speech: Speech normal.        Behavior: Behavior is cooperative.      UC Treatments / Results  Labs (all labs ordered are listed, but only abnormal results are displayed) Labs Reviewed - No data to display  EKG   Radiology No results found.  Procedures Procedures (including critical care time)  Medications Ordered in UC Medications - No data to display  Initial Impression / Assessment and Plan / UC Course  I have reviewed the triage vital signs and the nursing notes.  Pertinent labs & imaging results that were available during my care of the patient were reviewed by me and considered in my medical decision making (see chart for details).    Discussed exam findings and plan of care with patient, will treat with doxycycline  for acute respiratory infection , will also treat with erythromycin  for conjunctivitis ,strict go to ER precautions given.   Patient verbalized understanding to this provider.  Ddx: Acute respiratory infection, conjunctivitis, asthma flare , allergic reaction Final Clinical Impressions(s) / UC Diagnoses   Final diagnoses:  Acute  respiratory infection  Acute bacterial conjunctivitis of left eye  Vapes nicotine containing substance  Acute cough     Discharge Instructions  Take doxycycline  as prescribed  May use warm moist cotton balls wiping inner to outer eye to remove drainage from eyes,throw away,repeat. Wash hands before and after touching face,using eye medication. Do not rub eyes. Use eye medication as prescribed. Follow up with PCP/eye doctor if no improvement or worsening of symptoms-call for appointment.  Go to ER if you have vision changes.  Return as needed     ED Prescriptions     Medication Sig Dispense Auth. Provider   doxycycline  (VIBRAMYCIN ) 100 MG capsule Take 1 capsule (100 mg total) by mouth 2 (two) times daily for 5 days. 10 capsule Hasaan Radde, Rilla, NP   erythromycin  ophthalmic ointment Place a 1/2 inch ribbon of ointment into the left lower eyelid every 6 hours x 5 days 3.5 g Athen Riel, NP      PDMP not reviewed this encounter.   Aminta Rilla, NP 11/05/23 2026

## 2023-11-05 NOTE — Discharge Instructions (Addendum)
 Take doxycycline  as prescribed  May use warm moist cotton balls wiping inner to outer eye to remove drainage from eyes,throw away,repeat. Wash hands before and after touching face,using eye medication. Do not rub eyes. Use eye medication as prescribed. Follow up with PCP/eye doctor if no improvement or worsening of symptoms-call for appointment.  Go to ER if you have vision changes.  Return as needed

## 2023-11-05 NOTE — ED Triage Notes (Signed)
 Cough x 2 weeks Eye discharge and swelling since yesterday

## 2023-11-07 LAB — HIV/HCV ~~LOC~~ LAB
HIV 1/2 AB DIFFERENTIATION: POSITIVE
HIV Ag/Ab Combo: REACTIVE
Hepatitis C Ab: NONREACTIVE

## 2023-11-07 LAB — HEPATITIS SEROLOGY, ~~LOC~~ LAB
Hep B Core Total Ab: NONREACTIVE
Hep B S Ab: REACTIVE
Hepatitis B Surface Antigen: NONREACTIVE

## 2023-11-07 LAB — CHLAMYDIA/GC NAA, CONFIRMATION
Chlamydia trachomatis, NAA: NEGATIVE
Neisseria gonorrhoeae, NAA: NEGATIVE

## 2023-11-19 ENCOUNTER — Telehealth: Payer: Self-pay | Admitting: Family Medicine

## 2023-11-19 DIAGNOSIS — Z21 Asymptomatic human immunodeficiency virus [HIV] infection status: Secondary | ICD-10-CM | POA: Insufficient documentation

## 2023-11-19 NOTE — Telephone Encounter (Signed)
 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 New HIV-1 diagnosis. Patient prefers ID clinic in [place]. He voiced that he is hoping this is a false positive; discussed unlikelihood, but reassured he could discuss further with ID.   Also accepted counseling referral to LCSW here in [place].   Dorothyann Helling, MD 11/19/23  10:48 AM

## 2023-11-20 ENCOUNTER — Ambulatory Visit: Payer: Self-pay | Admitting: Infectious Diseases

## 2023-12-10 ENCOUNTER — Ambulatory Visit: Payer: Self-pay | Admitting: Licensed Clinical Social Worker

## 2023-12-10 DIAGNOSIS — F17200 Nicotine dependence, unspecified, uncomplicated: Secondary | ICD-10-CM | POA: Insufficient documentation

## 2023-12-10 DIAGNOSIS — F331 Major depressive disorder, recurrent, moderate: Secondary | ICD-10-CM
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
## 2023-12-18 ENCOUNTER — Ambulatory Visit: Payer: Self-pay | Admitting: Licensed Clinical Social Worker

## 2023-12-18 NOTE — Progress Notes (Unsigned)
 Counselor/Therapist Progress Note  Patient ID: Alejandro Pope, MRN: [id number],    Date: 12/18/2023  Time Spent: ## total minutes. I spent ## minutes face to face with the patient on the date of service. I spent an additional ##  minutes on pre- and post-visit activities on the date of service including collateral, chart review, team discussion, and documentation.     Treatment Type: Individual Therapy  Reported Symptoms: {CHL AMB Reported Symptoms:[phone]}  Mental Status Exam:  Appearance:   {PSY:22683}     Behavior:  {PSY:21022743}  Motor:  {PSY:22302}  Speech/Language:   {PSY:22685}  Affect:  {PSY:22687}  Mood:  {PSY:31886}  Thought process:  {PSY:31888}  Thought content:    {PSY:([phone]}  Sensory/Perceptual disturbances:    {PSY:847-314-8971}  Orientation:  {PSY:30297}  Attention:  {PSY:22877}  Concentration:  {PSY:([phone] 251-3308}  Memory:  {PSY:651-616-5471}  Fund of knowledge:   {PSY:([phone] 251-3308}  Insight:    {PSY:([phone] 251-3308}  Judgment:   {PSY:([phone] 251-3308}  Impulse Control:  {PSY:([phone] [phone]}   Risk Assessment: Danger to Self:  {PSY:22692} Self-injurious Behavior: {PSY:22692} Danger to Others: {PSY:22692} Duty to Warn:{PSY:311194} Physical Aggression / Violence:{PSY:21197} Access to Firearms a concern: {PSY:21197} Gang Involvement:{PSY:21197}  Subjective: Patient reports     Interventions: Cognitive Behavioral Therapy and Client Centered  Checked in with the patient and conducted a brief assessment of current symptoms, psychosocial stressors, and safety. Collaboratively set the session agenda. Reviewed content from the previous session, including assessment findings. Introduced and reviewed the therapeutic modalities being utilized, including an integrated CBT approach (drawing from CBT, ACT, DBT, and Mindfulness) as well as Somatic Processing techniques. Reviewed core concepts of Cognitive Behavioral Therapy and discussed how these approaches will support the patient's  goals. Explored the patient's treatment goals, continued to build rapport, and worked collaboratively to begin developing a treatment plan tailored to their needs. Provided therapeutic support through active listening, validation of emotional experiences, and by highlighting the patient's strengths.     Diagnosis:   ICD-10-CM   1. Major depressive disorder, recurrent episode, moderate, with anxious distress  F33.1      Plan: Patient's goal of treatment is     Future Appointments  Date Time Provider Department Center  12/18/2023  3:00 PM Ellender Palma, LCSW [facility] None    Palma Ellender, [place]

## 2024-03-22 ENCOUNTER — Ambulatory Visit: Admission: EM | Admit: 2024-03-22 | Discharge: 2024-03-22 | Payer: Self-pay

## 2024-03-22 DIAGNOSIS — K6289 Other specified diseases of anus and rectum: Secondary | ICD-10-CM

## 2024-03-22 NOTE — Discharge Instructions (Signed)
 As we discussed, there is concern that you may have a rectal or perirectal abscess given your cluster symptoms and medical history.  You need imaging, lab work, and possible IV antibiotics that we cannot perform here in urgent care.  Please go to the emergency department at [facility] to be evaluated for your symptoms.

## 2024-03-22 NOTE — ED Triage Notes (Signed)
 Pt c/o rectal pain x1 wk. Also c/o chills & feverish x1 day. States has been having to strain harder than usual. Pain is constant. Denies any recent trauma or bleeding. OAF:[id number].

## 2024-03-22 NOTE — ED Provider Notes (Signed)
 [facility]    CSN: [id number] Arrival date & time: 03/22/24  1113      History   Chief Complaint Chief Complaint  Patient presents with   Rectal Pain    HPI Alejandro Pope is a 23 y.o. male.   HPI  23 year old male with past medical history significant for heart murmur, asthma, and HIV-1 infection presents for evaluation of 1 week worth of rectal pain with subjective fever and chills.  He reports his last normal bowel movement was 3 days ago.  Yesterday he had significant pain when he attempted to have a bowel movement.  He denies any nausea or vomiting.  No rectal bleeding or bloody or mucousy discharge.  Past Medical History:  Diagnosis Date   Asthma    Heart murmur     Patient Active Problem List   Diagnosis Date Noted   Major depressive disorder, recurrent episode, moderate, with anxious distress 12/10/2023   Tobacco use disorder 12/10/2023   HIV infection (HCC) 11/19/2023   Acute respiratory infection 11/05/2023   Acute bacterial conjunctivitis of left eye 11/05/2023   Cough 11/05/2023   Vapes nicotine containing substance 11/05/2023   Need for post exposure prophylaxis for rabies 09/05/2023   Diffuse abdominal pain 09/02/2014   RLQ abdominal tenderness 09/02/2014   Cardiac murmur 10/14/2007   Eczema 10/14/2007   Health check for child over 44 days old 10/13/2007   Mild intermittent asthma 10/13/2007   Enuresis 09/22/2007    Past Surgical History:  Procedure Laterality Date   NO PAST SURGERIES         Home Medications    Prior to Admission medications   Medication Sig Start Date End Date Taking? Authorizing Provider  erythromycin  ophthalmic ointment Place a 1/2 inch ribbon of ointment into the left lower eyelid every 6 hours x 5 days 11/05/23   Defelice, Rilla, NP  lidocaine  (XYLOCAINE ) 2 % solution Use as directed 15 mLs in the mouth or throat every 4 (four) hours as needed for mouth pain. 02/01/22   Triplett, Cari B, FNP  sucralfate   (CARAFATE ) 1 GM/10ML suspension Take 10 mLs (1 g total) by mouth 4 (four) times daily -  with meals and at bedtime. 02/01/22   Herlinda Kirk NOVAK, FNP    Family History History reviewed. No pertinent family history.  Social History Social History   Tobacco Use   Smoking status: Never   Smokeless tobacco: Never  Vaping Use   Vaping status: Former  Substance Use Topics   Alcohol use: No   Drug use: Yes    Types: Marijuana     Allergies   Patient has no known allergies.   Review of Systems Review of Systems  Constitutional:  Positive for chills and fever.  Gastrointestinal:  Positive for rectal pain. Negative for anal bleeding and blood in stool.     Physical Exam Triage Vital Signs ED Triage Vitals  Encounter Vitals Group     BP --      Girls Systolic BP Percentile --      Girls Diastolic BP Percentile --      Boys Systolic BP Percentile --      Boys Diastolic BP Percentile --      Pulse --      Resp --      Temp --      Temp src --      SpO2 --      Weight 03/22/24 1145 147 lb (66.7 kg)  Height --      Head Circumference --      Peak Flow --      Pain Score 03/22/24 1147 8     Pain Loc --      Pain Education --      Exclude from Growth Chart --    No data found.  Updated Vital Signs BP 119/80 (BP Location: Right Arm)   Pulse 78   Temp 99.9 F (37.7 C) (Oral)   Resp 16   Wt 147 lb (66.7 kg)   SpO2 96%   BMI 21.09 kg/m   Visual Acuity Right Eye Distance:   Left Eye Distance:   Bilateral Distance:    Right Eye Near:   Left Eye Near:    Bilateral Near:     Physical Exam Vitals and nursing note reviewed.  Constitutional:      Appearance: Normal appearance. He is not ill-appearing.  HENT:     Head: Normocephalic and atraumatic.  Skin:    General: Skin is warm and dry.     Capillary Refill: Capillary refill takes less than 2 seconds.     Findings: No rash.  Neurological:     General: No focal deficit present.     Mental Status: He is  alert and oriented to person, place, and time.      UC Treatments / Results  Labs (all labs ordered are listed, but only abnormal results are displayed) Labs Reviewed - No data to display  EKG   Radiology No results found.  Procedures Procedures (including critical care time)  Medications Ordered in UC Medications - No data to display  Initial Impression / Assessment and Plan / UC Course  I have reviewed the triage vital signs and the nursing notes.  Pertinent labs & imaging results that were available during my care of the patient were reviewed by me and considered in my medical decision making (see chart for details).   Patient is a pleasant, nontoxic-appearing 23 year old male presenting for evaluation of rectal pain, chills, and subjective fever.  He has an elevated temp of 99.9 here in clinic.  He was diagnosed with HIV 1 in July.  He is followed by [facility] and is receiving cabotegravir IM injection every 2 months for control of HIV.  His most recent injection was 02/10/2024.  He states he is concerned he may be experiencing constipation though he his last normal bowel movement was 2 days ago.  I have advised him that given his medical history there is concern for possible rectal or perirectal abscess.  He states he has not been sexually active since July.  I still feel that he needs to be evaluated in the ER as he needs imaging that we cannot perform here at urgent care as well as lab work and possible IV antibiotics.  He is followed by [facility] so he will go to [facility].   Final Clinical Impressions(s) / UC Diagnoses   Final diagnoses:  Rectal pain     Discharge Instructions      As we discussed, there is concern that you may have a rectal or perirectal abscess given your cluster symptoms and medical history.  You need imaging, lab work, and possible IV antibiotics that we cannot perform here in urgent care.  Please go to the emergency department  at [facility] to be evaluated for your symptoms.     ED Prescriptions   None    PDMP not reviewed this encounter.  Bernardino Ditch, NP 03/22/24 1200

## 2024-04-30 ENCOUNTER — Encounter: Payer: Self-pay | Admitting: Emergency Medicine

## 2024-04-30 ENCOUNTER — Emergency Department
Admission: EM | Admit: 2024-04-30 | Discharge: 2024-04-30 | Disposition: A | Discharge status: Home / Self Care | Payer: Self-pay | Attending: Emergency Medicine | Admitting: Emergency Medicine

## 2024-04-30 ENCOUNTER — Other Ambulatory Visit: Payer: Self-pay

## 2024-04-30 DIAGNOSIS — R059 Cough, unspecified: Secondary | ICD-10-CM | POA: Insufficient documentation

## 2024-04-30 DIAGNOSIS — R197 Diarrhea, unspecified: Secondary | ICD-10-CM | POA: Insufficient documentation

## 2024-04-30 DIAGNOSIS — R1110 Vomiting, unspecified: Secondary | ICD-10-CM | POA: Insufficient documentation

## 2024-04-30 DIAGNOSIS — Z21 Asymptomatic human immunodeficiency virus [HIV] infection status: Secondary | ICD-10-CM | POA: Insufficient documentation

## 2024-04-30 DIAGNOSIS — R6889 Other general symptoms and signs: Secondary | ICD-10-CM

## 2024-04-30 DIAGNOSIS — Z87891 Personal history of nicotine dependence: Secondary | ICD-10-CM | POA: Insufficient documentation

## 2024-04-30 DIAGNOSIS — R0981 Nasal congestion: Secondary | ICD-10-CM | POA: Insufficient documentation

## 2024-04-30 DIAGNOSIS — J45909 Unspecified asthma, uncomplicated: Secondary | ICD-10-CM | POA: Insufficient documentation

## 2024-04-30 MED ORDER — PROMETHAZINE-DM 6.25-15 MG/5ML PO SYRP: 5.0000 mL | ORAL_SOLUTION | Freq: Four times a day (QID) | ORAL | 0 refills | Status: DC | PRN

## 2024-04-30 MED ORDER — PROMETHAZINE-DM 6.25-15 MG/5ML PO SYRP: 5.0000 mL | ORAL_SOLUTION | Freq: Four times a day (QID) | ORAL | 0 refills | Status: AC | PRN

## 2024-04-30 NOTE — ED Provider Notes (Signed)
 "  [facility] Provider Note    Event Date/Time   First MD Initiated Contact with Patient 04/30/24 1028     (approximate)   History   Cough and Nasal Congestion   HPI  Alejandro Pope is a 24 y.o. male   presents to the ED with complaint of bodyaches, chills, headache, cough, congestion, vomiting and diarrhea.  Patient states that this began 2 days ago and he has been exposed to a friend who also has similar symptoms and is here to be seen as well.  Patient is a daily smoker but is discontinued at this time because of coughing.  Patient is positive for HIV, intermittent asthma mild.      Physical Exam   Triage Vital Signs: ED Triage Vitals  Encounter Vitals Group     BP 04/30/24 1019 118/85     Girls Systolic BP Percentile --      Girls Diastolic BP Percentile --      Boys Systolic BP Percentile --      Boys Diastolic BP Percentile --      Pulse Rate 04/30/24 1019 84     Resp 04/30/24 1019 18     Temp 04/30/24 1019 98.7 F (37.1 C)     Temp Source 04/30/24 1019 Oral     SpO2 04/30/24 1019 100 %     Weight 04/30/24 1020 150 lb (68 kg)     Height 04/30/24 1020 5' 10 (1.778 m)     Head Circumference --      Peak Flow --      Pain Score 04/30/24 1020 9     Pain Loc --      Pain Education --      Exclude from Growth Chart --     Most recent vital signs: Vitals:   04/30/24 1019  BP: 118/85  Pulse: 84  Resp: 18  Temp: 98.7 F (37.1 C)  SpO2: 100%     General: Awake, no distress.  CV:  Good peripheral perfusion.  Heart rate and rate rhythm. Resp:  Normal effort.  Clear bilaterally. Abd:  No distention.  Other:     ED Results / Procedures / Treatments   Labs (all labs ordered are listed, but only abnormal results are displayed) Labs Reviewed - No data to display    PROCEDURES:  Critical Care performed:   Procedures   MEDICATIONS ORDERED IN ED: Medications - No data to display   IMPRESSION / MDM / ASSESSMENT AND PLAN /  ED COURSE  I reviewed the triage vital signs and the nursing notes.   Differential diagnosis includes, but is not limited to, viral illness, influenza, RSV, COVID, otitis media, URI.  24 year old presents to the ED with viral URI symptoms, vomiting and diarrhea.  Patient is here with a friend that has similar symptoms.  Exam was reassuring and patient was made aware that most likely he does have a viral illness.  A prescription for promethazine -dextromethorphan was sent to the pharmacy for him to take as needed for cough and congestion.  Continue with Tylenol as needed for body aches and return to the emergency department if any severe worsening of symptoms.      Patient's presentation is most consistent with acute complicated illness / injury requiring diagnostic workup.  FINAL CLINICAL IMPRESSION(S) / ED DIAGNOSES   Final diagnoses:  Flu-like symptoms     Rx / DC Orders   ED Discharge Orders  Ordered    promethazine -dextromethorphan (PROMETHAZINE -DM) 6.25-15 MG/5ML syrup  4 times daily PRN,   Status:  Discontinued        04/30/24 1109    promethazine -dextromethorphan (PROMETHAZINE -DM) 6.25-15 MG/5ML syrup  4 times daily PRN        04/30/24 1109             Note:  This document was prepared using Dragon voice recognition software and may include unintentional dictation errors.   Saunders Shona CROME, PA-C 04/30/24 1307    Bradler, Evan K, MD 05/01/24 [phone]  "

## 2024-04-30 NOTE — Discharge Instructions (Signed)
 Follow-up with your primary care provider if any continued problems. A prescription for cough promethazine-dextromethorphan for fan was sent to the pharmacy to as needed for cough.  Increase fluids.  Tylenol or ibuprofen  as needed for body aches, fever or headache.  Clear liquids the remainder of today to see if this helps with diarrhea.  Return to the emergency department if any severe worsening of your symptoms or urgent concerns.

## 2024-04-30 NOTE — ED Triage Notes (Signed)
 C/o body aches, chills, HA, cough and congestion. GCS 15. Ambulatory in triage
